# Patient Record
Sex: Female | Born: 2000 | Race: White | Hispanic: No | Marital: Single | Smoking: Never smoker
Health system: Southern US, Community
[De-identification: ages and names within clinical notes are randomized; demographics above are authoritative.]

---

## 2020-03-31 ENCOUNTER — Encounter (HOSPITAL_COMMUNITY): Payer: Self-pay | Admitting: Urgent Care

## 2020-03-31 ENCOUNTER — Ambulatory Visit (HOSPITAL_COMMUNITY)
Admission: EM | Admit: 2020-03-31 | Discharge: 2020-03-31 | Disposition: A | Discharge status: Home / Self Care | Payer: Self-pay

## 2020-03-31 ENCOUNTER — Other Ambulatory Visit: Payer: Self-pay

## 2020-03-31 ENCOUNTER — Ambulatory Visit (INDEPENDENT_AMBULATORY_CARE_PROVIDER_SITE_OTHER): Payer: Self-pay

## 2020-03-31 DIAGNOSIS — S161XXA Strain of muscle, fascia and tendon at neck level, initial encounter: Secondary | ICD-10-CM

## 2020-03-31 DIAGNOSIS — M898X1 Other specified disorders of bone, shoulder: Secondary | ICD-10-CM

## 2020-03-31 DIAGNOSIS — M25512 Pain in left shoulder: Secondary | ICD-10-CM

## 2020-03-31 DIAGNOSIS — M542 Cervicalgia: Secondary | ICD-10-CM

## 2020-03-31 MED ORDER — NAPROXEN 500 MG PO TABS: 500.0000 mg | ORAL_TABLET | Freq: Two times a day (BID) | ORAL | 0 refills | Status: AC

## 2020-03-31 MED ORDER — TIZANIDINE HCL 4 MG PO TABS: 4.0000 mg | ORAL_TABLET | Freq: Three times a day (TID) | ORAL | 0 refills | Status: AC | PRN

## 2020-03-31 NOTE — Discharge Instructions (Signed)
Naproxen is for pain and inflammation, can be taken twice daily with food. Tizanidine is a muscle relaxant and can be used 3 times daily as needed but if it makes you too sleepy, then just use this at bedtime.
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
